# Patient Record
Sex: Male | Born: 1995 | Race: Black or African American | Hispanic: No | Marital: Single | Smoking: Never smoker
Health system: Southern US, Community
[De-identification: ages and names within clinical notes are randomized; demographics above are authoritative.]

---
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
## 2014-08-17 ENCOUNTER — Emergency Department (HOSPITAL_COMMUNITY)
Admission: EM | Admit: 2014-08-17 | Discharge: 2014-08-17 | Disposition: A | Discharge status: Home / Self Care | Payer: Self-pay | Attending: Emergency Medicine | Admitting: Emergency Medicine

## 2014-08-17 ENCOUNTER — Encounter (HOSPITAL_COMMUNITY): Payer: Self-pay | Admitting: Emergency Medicine

## 2014-08-17 DIAGNOSIS — D508 Other iron deficiency anemias: Secondary | ICD-10-CM | POA: Insufficient documentation

## 2014-08-17 DIAGNOSIS — B349 Viral infection, unspecified: Secondary | ICD-10-CM | POA: Insufficient documentation

## 2014-08-17 LAB — URINALYSIS, ROUTINE W REFLEX MICROSCOPIC
BILIRUBIN URINE: NEGATIVE
GLUCOSE, UA: NEGATIVE mg/dL
Hgb urine dipstick: NEGATIVE
KETONES UR: NEGATIVE mg/dL
Leukocytes, UA: NEGATIVE
NITRITE: NEGATIVE
PH: 6.5 (ref 5.0–8.0)
Protein, ur: NEGATIVE mg/dL
Specific Gravity, Urine: 1.02 (ref 1.005–1.030)
UROBILINOGEN UA: 1 mg/dL (ref 0.0–1.0)

## 2014-08-17 LAB — BASIC METABOLIC PANEL
Anion gap: 5 (ref 5–15)
BUN: 10 mg/dL (ref 6–20)
CHLORIDE: 106 mmol/L (ref 101–111)
CO2: 26 mmol/L (ref 22–32)
CREATININE: 0.7 mg/dL (ref 0.61–1.24)
Calcium: 8.8 mg/dL — ABNORMAL LOW (ref 8.9–10.3)
Glucose, Bld: 117 mg/dL — ABNORMAL HIGH (ref 65–99)
Potassium: 4 mmol/L (ref 3.5–5.1)
Sodium: 137 mmol/L (ref 135–145)

## 2014-08-17 LAB — CBC WITH DIFFERENTIAL/PLATELET
BASOS PCT: 1 % (ref 0–1)
Basophils Absolute: 0 10*3/uL (ref 0.0–0.1)
EOS ABS: 0.3 10*3/uL (ref 0.0–0.7)
Eosinophils Relative: 8 % — ABNORMAL HIGH (ref 0–5)
HEMATOCRIT: 37.9 % — AB (ref 39.0–52.0)
Hemoglobin: 12.2 g/dL — ABNORMAL LOW (ref 13.0–17.0)
LYMPHS ABS: 1 10*3/uL (ref 0.7–4.0)
LYMPHS PCT: 25 % (ref 12–46)
MCH: 26.5 pg (ref 26.0–34.0)
MCHC: 32.2 g/dL (ref 30.0–36.0)
MCV: 82.4 fL (ref 78.0–100.0)
MONOS PCT: 11 % (ref 3–12)
Monocytes Absolute: 0.4 10*3/uL (ref 0.1–1.0)
NEUTROS PCT: 55 % (ref 43–77)
Neutro Abs: 2.1 10*3/uL (ref 1.7–7.7)
PLATELETS: 200 10*3/uL (ref 150–400)
RBC: 4.6 MIL/uL (ref 4.22–5.81)
RDW: 13.9 % (ref 11.5–15.5)
WBC: 3.8 10*3/uL — ABNORMAL LOW (ref 4.0–10.5)

## 2014-08-17 MED ORDER — PROMETHAZINE HCL 25 MG RE SUPP: 12.5000 mg | Freq: Once | RECTAL | Status: DC

## 2014-08-17 NOTE — ED Provider Notes (Signed)
CSN: [id number]     Arrival date & time 08/17/14  0015 History   First MD Initiated Contact with Patient 08/17/14 0021     Chief Complaint  Patient presents with  . Fever  . Generalized Body Aches     (Consider location/radiation/quality/duration/timing/severity/associated sxs/prior Treatment) Patient is a 19 y.o. male presenting with URI. The history is provided by the patient.  URI Presenting symptoms: congestion and fever   Presenting symptoms: no cough, no ear pain and no sore throat   Severity:  Mild Onset quality:  Gradual Duration:  1 week Timing:  Intermittent Progression:  Improving Chronicity:  New Relieved by:  None tried Worsened by:  Nothing tried Ineffective treatments:  None tried Associated symptoms: myalgias    Xavier Bradley is a 19 y.o. male who presents to the ED with generalized body aches, fever and nasal congestion for the past week. He states that he has started to feel better today but decided to come in for evaluation. He had one episode of nausea last night but did not vomit and has not had nausea since then.   History reviewed. No pertinent past medical history. History reviewed. No pertinent past surgical history. History reviewed. No pertinent family history. History  Substance Use Topics  . Smoking status: Never Smoker   . Smokeless tobacco: Never Used  . Alcohol Use: No    Review of Systems  Constitutional: Positive for fever.  HENT: Positive for congestion. Negative for ear pain and sore throat.   Respiratory: Negative for cough.   Musculoskeletal: Positive for myalgias.      Allergies  Review of patient's allergies indicates no known allergies.  Home Medications   Prior to Admission medications   Medication Sig Start Date End Date Taking? Authorizing Provider  loratadine-pseudoephedrine (CLARITIN-D 12 HOUR) 5-120 MG per tablet Take 1 tablet by mouth 2 (two) times daily. 05/28/12   Burgess Amor, PA-C   BP 131/88 mmHg  Pulse 81   Temp(Src) 98.9 F (37.2 C) (Oral)  Resp 20  Ht 6' (1.829 m)  Wt 130 lb (58.968 kg)  BMI 17.63 kg/m2  SpO2 100% Physical Exam  Constitutional: He is oriented to person, place, and time. He appears well-developed and well-nourished. No distress.  HENT:  Head: Normocephalic.  Right Ear: Tympanic membrane normal.  Left Ear: Tympanic membrane normal.  Nose: Rhinorrhea present.  Mouth/Throat: Uvula is midline, oropharynx is clear and moist and mucous membranes are normal.  Eyes: EOM are normal.  Neck: Neck supple.  Cardiovascular: Normal rate and regular rhythm.   Pulmonary/Chest: Effort normal. He has no wheezes. He has no rales.  Abdominal: Soft. Bowel sounds are normal. There is no tenderness.  Musculoskeletal: Normal range of motion.  Neurological: He is alert and oriented to person, place, and time. No cranial nerve deficit.  Skin: Skin is warm and dry.  Psychiatric: He has a normal mood and affect. His behavior is normal.  Nursing note and vitals reviewed.   ED Course  Procedures (including critical care time) Labs Review Results for orders placed or performed during the hospital encounter of 08/17/14 (from the past 24 hour(s))  Urinalysis, Routine w reflex microscopic (not at [facility])     Status: None   Collection Time: 08/17/14  1:16 AM  Result Value Ref Range   Color, Urine YELLOW YELLOW   APPearance CLEAR CLEAR   Specific Gravity, Urine 1.020 1.005 - 1.030   pH 6.5 5.0 - 8.0   Glucose, UA NEGATIVE NEGATIVE mg/dL  Hgb urine dipstick NEGATIVE NEGATIVE   Bilirubin Urine NEGATIVE NEGATIVE   Ketones, ur NEGATIVE NEGATIVE mg/dL   Protein, ur NEGATIVE NEGATIVE mg/dL   Urobilinogen, UA 1.0 0.0 - 1.0 mg/dL   Nitrite NEGATIVE NEGATIVE   Leukocytes, UA NEGATIVE NEGATIVE  Basic metabolic panel     Status: Abnormal (Preliminary result)   Collection Time: 08/17/14  1:24 AM  Result Value Ref Range   Sodium 137 135 - 145 mmol/L   Potassium 4.0 3.5 - 5.1 mmol/L   Chloride 106  101 - 111 mmol/L   CO2 26 22 - 32 mmol/L   Glucose, Bld 117 (H) 65 - 99 mg/dL   BUN PENDING 6 - 20 mg/dL   Creatinine, Ser 1.610.70 0.61 - 1.24 mg/dL   Calcium 8.8 (L) 8.9 - 10.3 mg/dL   GFR calc non Af Amer >60 >60 mL/min   GFR calc Af Amer >60 >60 mL/min   Anion gap 5 5 - 15  CBC with Differential/Platelet     Status: Abnormal   Collection Time: 08/17/14  1:24 AM  Result Value Ref Range   WBC 3.8 (L) 4.0 - 10.5 K/uL   RBC 4.60 4.22 - 5.81 MIL/uL   Hemoglobin 12.2 (L) 13.0 - 17.0 g/dL   HCT 09.637.9 (L) 04.539.0 - 40.952.0 %   MCV 82.4 78.0 - 100.0 fL   MCH 26.5 26.0 - 34.0 pg   MCHC 32.2 30.0 - 36.0 g/dL   RDW 81.113.9 91.411.5 - 78.215.5 %   Platelets 200 150 - 400 K/uL   Neutrophils Relative % 55 43 - 77 %   Neutro Abs 2.1 1.7 - 7.7 K/uL   Lymphocytes Relative 25 12 - 46 %   Lymphs Abs 1.0 0.7 - 4.0 K/uL   Monocytes Relative 11 3 - 12 %   Monocytes Absolute 0.4 0.1 - 1.0 K/uL   Eosinophils Relative 8 (H) 0 - 5 %   Eosinophils Absolute 0.3 0.0 - 0.7 K/uL   Basophils Relative 1 0 - 1 %   Basophils Absolute 0.0 0.0 - 0.1 K/uL     MDM  19 y.o. male with generalized body aches, nasal congestion and fever that has improved over the past 24 hours. Stable for d/c without n/v/d, no fever. Patient does have mild anemia and will start vitamins with Fe. He will follow up with a PCP for recheck. He will return here if any problems arise.  Final diagnoses:  Viral illness  Other iron deficiency anemias      [facility] Center[facility] M Neese, NP 08/17/14 [phone]  Devoria AlbeIva Knapp, MD 08/17/14 [phone]

## 2014-08-17 NOTE — Discharge Instructions (Signed)
Your urine and blood work tonight are normal except you have mild anemia.  Take vitamins plus iron and follow up with a primary care doctor to be sure it is improving. Return here as needed.

## 2014-08-17 NOTE — ED Notes (Signed)
Patient complaining of generalized body aches, fever, and nasal congestion since Thursday.

## 2014-12-31 IMAGING — CR DG CHEST 2V
2 series · 2 of 2 positions shown · non-contrast
Comparison: 12/24/2007

CLINICAL DATA: Cough, shortness of breath, fever

CHEST - 2 VIEW

[view not recorded (1 of 2)]
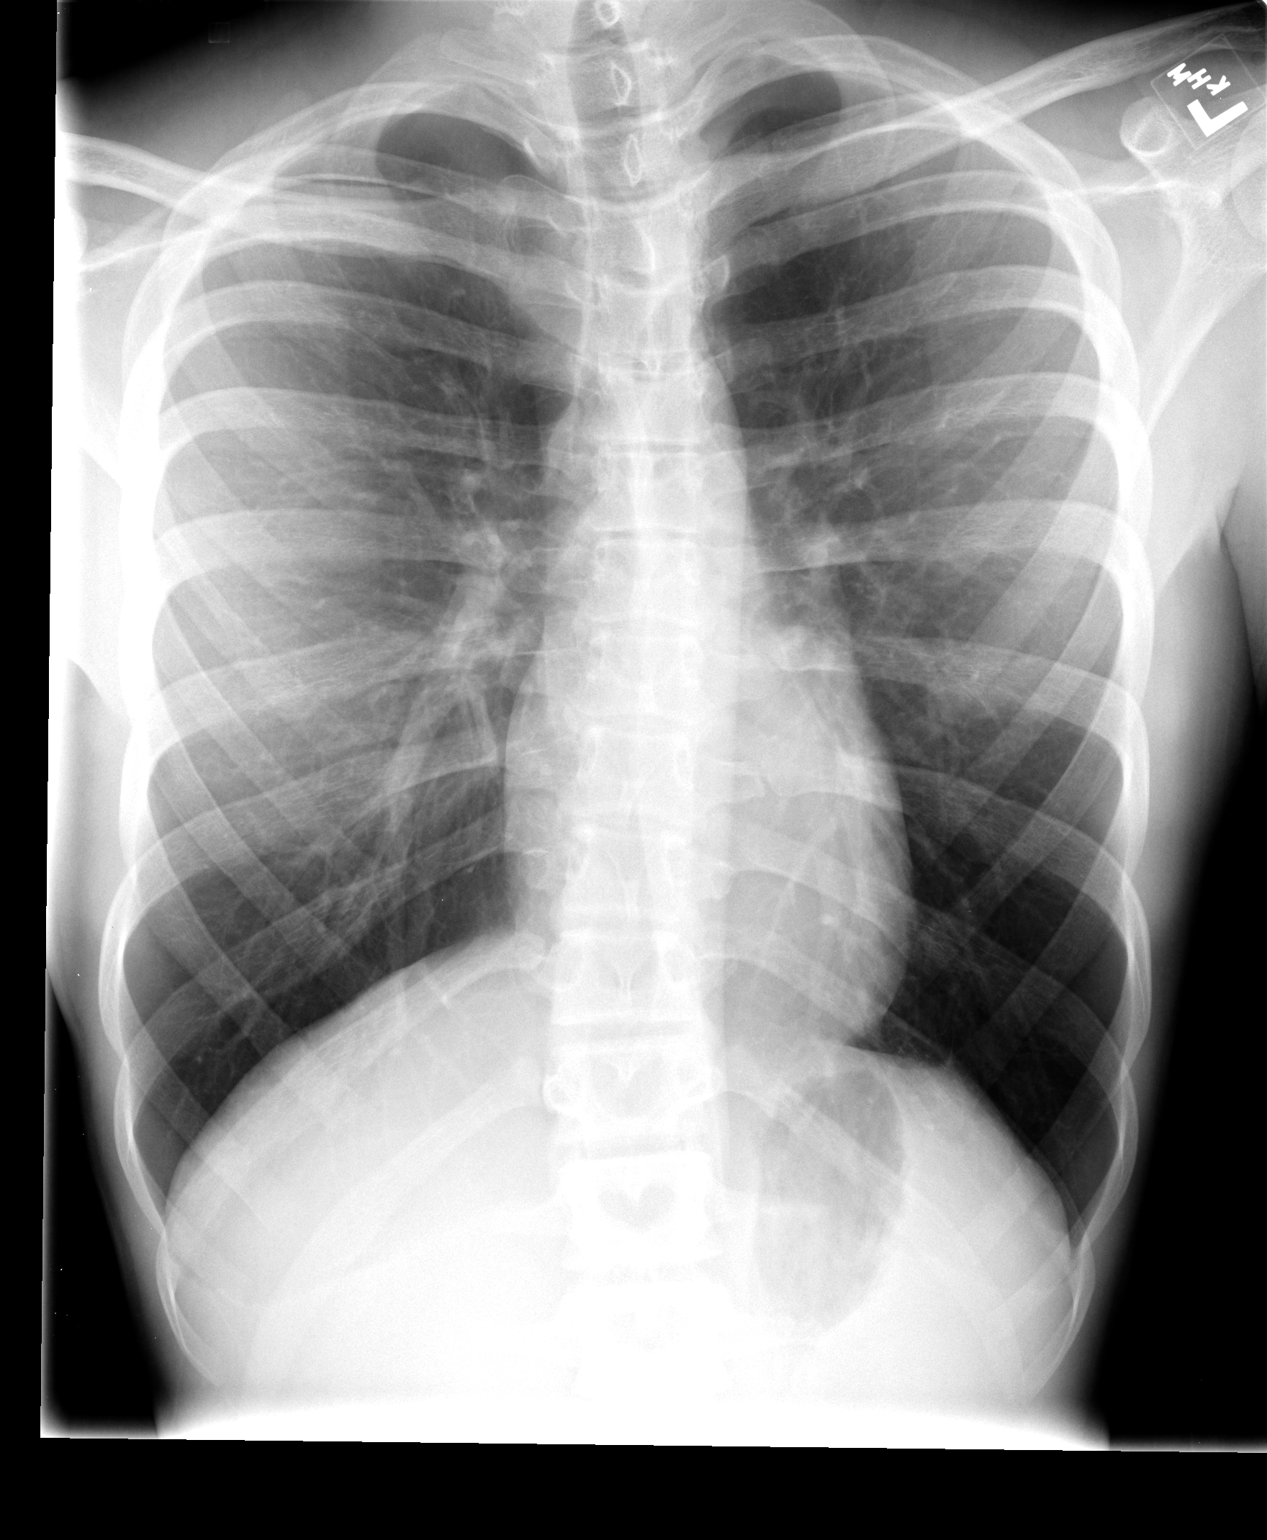

[view not recorded (2 of 2)]
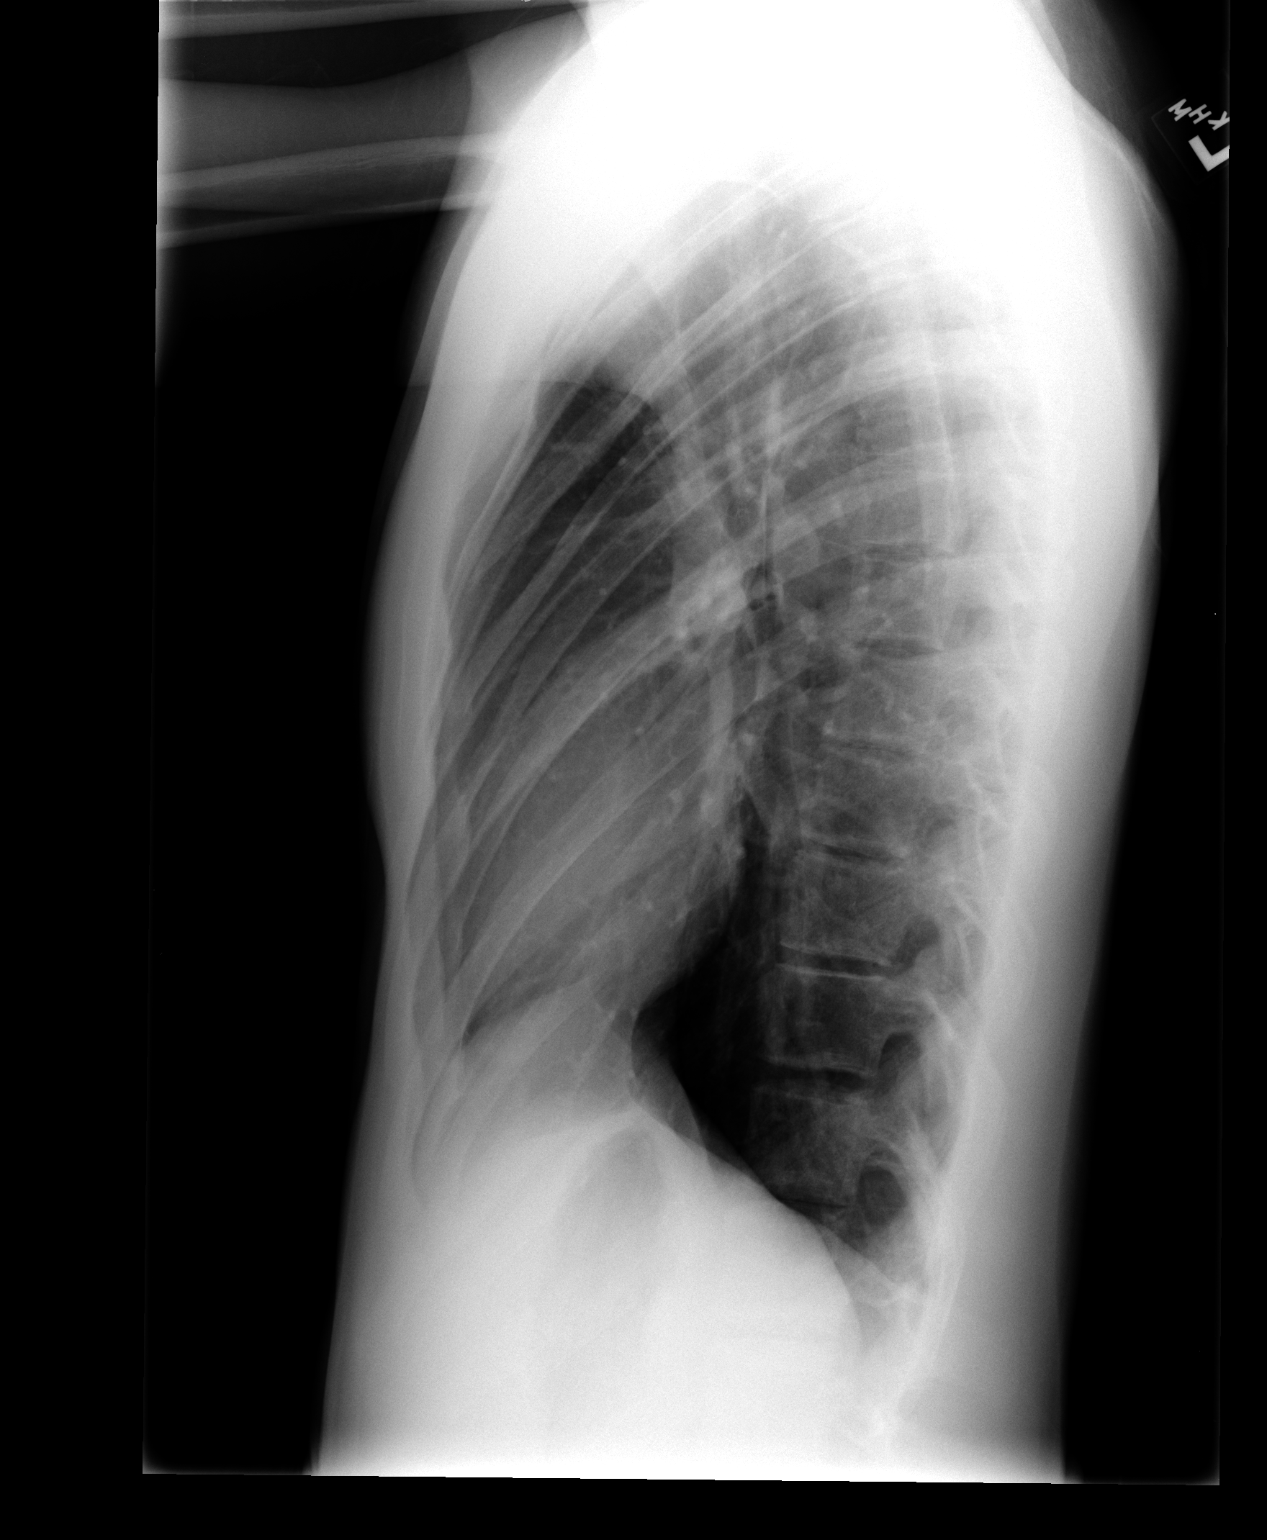

[2 of 2 positions shown; findings below may reference images not displayed]

FINDINGS: Normal heart size, mediastinal contours, and pulmonary vascularity.
Lungs chronically hyperinflated but clear.
No pleural effusion or pneumothorax.
No acute osseous findings.
IMPRESSION: No acute abnormalities.

## 2018-02-17 ENCOUNTER — Emergency Department (HOSPITAL_COMMUNITY): Payer: Worker's Compensation

## 2018-02-17 ENCOUNTER — Emergency Department (HOSPITAL_COMMUNITY)
Admission: EM | Admit: 2018-02-17 | Discharge: 2018-02-17 | Disposition: A | Discharge status: Home / Self Care | Payer: Worker's Compensation | Attending: Emergency Medicine | Admitting: Emergency Medicine

## 2018-02-17 ENCOUNTER — Other Ambulatory Visit: Payer: Self-pay

## 2018-02-17 DIAGNOSIS — Y990 Civilian activity done for income or pay: Secondary | ICD-10-CM | POA: Diagnosis not present

## 2018-02-17 DIAGNOSIS — W3182XA Contact with other commercial machinery, initial encounter: Secondary | ICD-10-CM | POA: Diagnosis not present

## 2018-02-17 DIAGNOSIS — Z23 Encounter for immunization: Secondary | ICD-10-CM | POA: Insufficient documentation

## 2018-02-17 DIAGNOSIS — Y93G1 Activity, food preparation and clean up: Secondary | ICD-10-CM | POA: Diagnosis not present

## 2018-02-17 DIAGNOSIS — S61311A Laceration without foreign body of left index finger with damage to nail, initial encounter: Secondary | ICD-10-CM | POA: Insufficient documentation

## 2018-02-17 DIAGNOSIS — Y92511 Restaurant or cafe as the place of occurrence of the external cause: Secondary | ICD-10-CM | POA: Diagnosis not present

## 2018-02-17 MED ORDER — TETANUS-DIPHTH-ACELL PERTUSSIS 5-2.5-18.5 LF-MCG/0.5 IM SUSP
0.5000 mL | Freq: Once | INTRAMUSCULAR | Status: AC
  Administered 2018-02-17: 0.5 mL via INTRAMUSCULAR
  Filled 2018-02-17: qty 0.5

## 2018-02-17 MED ORDER — LIDOCAINE HCL (PF) 1 % IJ SOLN
5.0000 mL | Freq: Once | INTRAMUSCULAR | Status: AC
  Administered 2018-02-17: 5 mL
  Filled 2018-02-17: qty 6

## 2018-02-17 NOTE — Discharge Instructions (Addendum)
You have been diagnosed today with laceration of the left index finger with nail bed injury.  At this time there does not appear to be the presence of an emergent medical condition, however there is always the potential for conditions to change. Please read and follow the below instructions.  Please return to the Emergency Department immediately for any new or worsening symptoms. Please be sure to follow up with your Primary Care Provider next week regarding your visit today; please call their office to schedule an appointment even if you are feeling better for a follow-up visit. Due to location of your laceration please return to the emergency department in 48 hours for wound recheck.  Additionally you will need to see your primary care doctor or return to the emergency department in 7 days for suture removal. You may use rest, ice and elevation to help with your pain.  May also use over-the-counter anti-inflammatory such as Tylenol or ibuprofen to help with your symptoms.  Get help right away if: You have very bad swelling around the wound. Your pain suddenly gets worse and is very bad. You have painful lumps near the wound or on skin that is anywhere on your body. You have a red streak going away from the wound. The wound is on your hand or foot and you cannot move a finger or toe like normal. The wound is on your hand or foot and you notice that your fingers or toes look pale or bluish.  Please read the additional information packets attached to your discharge summary.  Do not take your medicine if  develop an itchy rash, swelling in your mouth or lips, or difficulty breathing.

## 2018-02-17 NOTE — ED Provider Notes (Addendum)
[facility] EMERGENCY DEPARTMENT Provider Note   CSN: [id number] Arrival date & time: 02/17/18  1133     History   Chief Complaint Chief Complaint  Patient presents with  . Laceration    HPI Xavier Bradley is a 22 y.o. male presenting for laceration of the tip of the left index finger that occurred while at work today.  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  Patient states that pain was immediate, throbbing, severe and worsened with movement and palpation of the finger.  Patient controlled bleeding with direct pressure prior to arrival.  Patient states that pain is somewhat subsided since onset.  Patient is unsure of tetanus status, requesting booster. No other complaints.  HPI  No past medical history on file.  Patient Active Problem List   Diagnosis Date Noted  . GANGLION-HAND/WRIST 11/19/2009    No past surgical history on file.      Home Medications    Prior to Admission medications   Not on File    Family History No family history on file.  Social History Social History   Tobacco Use  . Smoking status: Never Smoker  . Smokeless tobacco: Never Used  Substance Use Topics  . Alcohol use: No  . Drug use: No     Allergies   Patient has no known allergies.   Review of Systems Review of Systems  Constitutional: Negative.  Negative for chills and fever.  Musculoskeletal: Negative.  Negative for arthralgias, joint swelling and myalgias.  Skin: Positive for wound (Laceration of left index finger).  Neurological: Negative.  Negative for weakness and numbness.   Physical Exam Updated Vital Signs BP 118/81 (BP Location: Left Arm)   Pulse 72   Temp 98.5 F (36.9 C) (Oral)   Resp 16   Ht 6\' 1"  (1.854 m)   Wt 63.5 kg   SpO2 100%   BMI 18.47 kg/m   Physical Exam  Constitutional: He appears well-developed and well-nourished. No distress.  HENT:  Head:  Normocephalic and atraumatic.  Right Ear: External ear normal.  Left Ear: External ear normal.  Nose: Nose normal.  Eyes: Pupils are equal, round, and reactive to light. EOM are normal.  Neck: Trachea normal and normal range of motion. No tracheal deviation present.  Pulmonary/Chest: Effort normal. No respiratory distress.  Abdominal: Soft. There is no tenderness. There is no rebound and no guarding.  Musculoskeletal: Normal range of motion.  Left hand: Laceration present to distal left index finger with nailbed involvement.  Please see picture below. Tenderness to palpation directly over laceration. No snuffbox tenderness to palpation. No tenderness to palpation over flexor sheath.  Finger adduction/abduction intact with 5/5 strength.  Thumb opposition intact. Full active and resisted ROM to flexion/extension at wrist, MCP, PIP and DIP of all fingers.  FDS/FDP intact. Grip 5/5 strength.  Radial artery 2+ with <2sec cap refill in all fingers.  Sensation intact to light-tough in median/ulnar/radial distributions.  Neurological: He is alert. GCS eye subscore is 4. GCS verbal subscore is 5. GCS motor subscore is 6.  Speech is clear and goal oriented, follows commands Major Cranial nerves without deficit, no facial droop Normal strength in upper and lower extremities bilaterally including dorsiflexion and plantar flexion, strong and equal grip strength Sensation normal to light touch Moves extremities without ataxia, coordination intact Normal gait  Skin: Skin is warm and dry.  Psychiatric: He has a normal  mood and affect. His behavior is normal.         ED Treatments / Results  Labs (all labs ordered are listed, but only abnormal results are displayed) Labs Reviewed - No data to display  EKG None  Radiology Dg Finger Index Left  Result Date: 02/17/2018 CLINICAL DATA:  Left index finger laceration. EXAM: LEFT INDEX FINGER 2+V COMPARISON:  None. FINDINGS: There is no evidence of  fracture or dislocation. There is no evidence of arthropathy or other focal bone abnormality. Slight irregularity of the soft tissues at the tip of the index finger. No radiopaque foreign body identified. IMPRESSION: 1. Slight irregularity of the soft tissues at the tip of the index finger, consistent with history of laceration. No radiopaque foreign body. 2.  No acute osseous abnormality. Electronically Signed   By: Obie Dredge M.D.   On: 02/17/2018 12:24    Procedures .Marland KitchenLaceration Repair Date/Time: 02/17/2018 5:28 PM Performed by: Bill Salinas, PA-C Authorized by: Bill Salinas, PA-C   Consent:    Consent obtained:  Verbal   Consent given by:  Patient and parent   Risks discussed:  Infection, pain, retained foreign body, vascular damage, tendon damage, poor wound healing, poor cosmetic result, nerve damage and need for additional repair Anesthesia (see MAR for exact dosages):    Anesthesia method:  Local infiltration   Local anesthetic:  Lidocaine 1% w/o epi Laceration details:    Location:  Finger   Finger location:  L index finger   Length (cm):  1   Depth (mm):  3 Repair type:    Repair type:  Simple Pre-procedure details:    Preparation:  Patient was prepped and draped in usual sterile fashion and imaging obtained to evaluate for foreign bodies Exploration:    Hemostasis achieved with:  Direct pressure   Wound exploration: wound explored through full range of motion and entire depth of wound probed and visualized     Wound extent: no foreign bodies/material noted, no muscle damage noted, no nerve damage noted, no tendon damage noted, no underlying fracture noted and no vascular damage noted     Contaminated: no   Treatment:    Area cleansed with:  Betadine and saline   Amount of cleaning:  Extensive   Irrigation solution:  Sterile saline   Irrigation volume:  1 L   Irrigation method:  Pressure wash Skin repair:    Repair method:  Sutures   Suture size:   4-0   Suture material:  Prolene   Suture technique:  Simple interrupted   Number of sutures:  1 Approximation:    Approximation:  Close Post-procedure details:    Dressing: Xeroform gauze.   Patient tolerance of procedure:  Tolerated well, no immediate complications   (including critical care time)  Medications Ordered in ED Medications  Tdap (BOOSTRIX) injection 0.5 mL (0.5 mLs Intramuscular Given 02/17/18 1207)  lidocaine (PF) (XYLOCAINE) 1 % injection 5 mL (5 mLs Infiltration Given 02/17/18 1208)   Initial Impression / Assessment and Plan / ED Course  I have reviewed the triage vital signs and the nursing notes.  Pertinent labs & imaging results that were available during my care of the patient were reviewed by me and considered in my medical decision making (see chart for details).    Xavier Bradley is a 22 y.o. male who presents to ED for laceration of the left index finger.  Wound thoroughly cleaned in ED today. Wound explored and bottom of wound seen  in a bloodless field.  Imaging negative for foreign body or osseous involvement.  Laceration repaired as dictated above. Patient counseled on home wound care.  Patient informed that he will likely lose part if not all of his nail due to injury today. Due to location of laceration involving the nailbed and poor PCP follow-up I have encouraged that the patient return to the emergency department in 48 hours for wound check.  Based on mechanism, examination and thorough cleaning today antibiotics are not indicated at this time.  Tdap booster given.  Patient was urged to return to the Emergency Department for worsening pain, swelling, expanding erythema especially if it streaks away from the affected area, fever, or for any additional concerns. Patient verbalized understanding of return precautions. All questions answered.  At this time there does not appear to be any evidence of an acute emergency medical condition and the patient appears  stable for discharge with appropriate outpatient follow up. Diagnosis was discussed with patient who verbalizes understanding of care plan and is agreeable to discharge. I have discussed return precautions with patient and mother at bedside who verbalize understanding of return precautions. Patient strongly encouraged to return in 48 hours for wound recheck or sooner for new/worsening symptoms.  Patient informed to return for suture removal in 7 days as well.  All questions answered.  Patient was also seen and evaluated by Dr. Adriana Simas during this visit who agrees with plan of care, repair, discharge and return in 48 hours for wound recheck.  Note: Portions of this report may have been transcribed using voice recognition software. Every effort was made to ensure accuracy; however, inadvertent computerized transcription errors may still be present.  Final Clinical Impressions(s) / ED Diagnoses   Final diagnoses:  Laceration of left index finger without foreign body with damage to nail, initial encounter    ED Discharge Orders    None       Bill Salinas, PA-C 02/17/18 1733    Bill Salinas, PA-C 02/17/18 1734    Donnetta Hutching, MD 02/21/18 0004

## 2018-02-17 NOTE — ED Triage Notes (Signed)
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 Pt reports pain to left index finger. Large laceration noted around tip of left index finger. Minimal bleeding noted to site.

## 2018-02-20 ENCOUNTER — Encounter (HOSPITAL_COMMUNITY): Payer: Self-pay | Admitting: Emergency Medicine

## 2018-02-20 ENCOUNTER — Other Ambulatory Visit: Payer: Self-pay

## 2018-02-20 ENCOUNTER — Emergency Department (HOSPITAL_COMMUNITY)
Admission: EM | Admit: 2018-02-20 | Discharge: 2018-02-20 | Disposition: A | Discharge status: Home / Self Care | Payer: Worker's Compensation | Attending: Emergency Medicine | Admitting: Emergency Medicine

## 2018-02-20 DIAGNOSIS — W260XXD Contact with knife, subsequent encounter: Secondary | ICD-10-CM | POA: Insufficient documentation

## 2018-02-20 DIAGNOSIS — Z5189 Encounter for other specified aftercare: Secondary | ICD-10-CM

## 2018-02-20 DIAGNOSIS — S6992XD Unspecified injury of left wrist, hand and finger(s), subsequent encounter: Secondary | ICD-10-CM | POA: Diagnosis present

## 2018-02-20 DIAGNOSIS — S61211D Laceration without foreign body of left index finger without damage to nail, subsequent encounter: Secondary | ICD-10-CM | POA: Insufficient documentation

## 2018-02-20 MED ORDER — BACITRACIN-NEOMYCIN-POLYMYXIN 400-5-5000 EX OINT
TOPICAL_OINTMENT | Freq: Once | CUTANEOUS | Status: AC
  Administered 2018-02-20: 1 via TOPICAL
  Filled 2018-02-20: qty 1

## 2018-02-20 NOTE — ED Triage Notes (Signed)
Pt says was here Friday for finger laceration and was told to return today for recheck.  Pt denies any complications.  Denies pain.

## 2018-02-20 NOTE — ED Notes (Signed)
Left index finger has 1 stitch. No signs of infection noted.

## 2018-02-20 NOTE — Discharge Instructions (Addendum)
Your wound appears to be healing well.  I recommend continuing to keep it covered and apply antibiotic ointment twice daily after a mild soap and water wash.  Stop using hydrogen peroxide on your wound, as discussed this can delay the healing process.  Return on Friday for suture removal as discussed.

## 2018-02-20 NOTE — ED Provider Notes (Signed)
[facility] EMERGENCY DEPARTMENT Provider Note   CSN: [id number] Arrival date & time: 02/20/18  8295     History   Chief Complaint Chief Complaint  Patient presents with  . Wound Check    HPI Xavier Bradley is a 22 y.o. 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  He is right-handed.  He sustained a laceration through the distal finger involving a wedge of the distal nail plate as well.  He was seen here and suturing was performed.  He was advised to return today for recheck of his injury.  He denies any worsening pain, no swelling or drainage from the wound site.  He has been using hydrogen peroxide to clean the wound.  The history is provided by the patient.    History reviewed. No pertinent past medical history.  Patient Active Problem List   Diagnosis Date Noted  . GANGLION-HAND/WRIST 11/19/2009    History reviewed. No pertinent surgical history.      Home Medications    Prior to Admission medications   Not on File    Family History No family history on file.  Social History Social History   Tobacco Use  . Smoking status: Never Smoker  . Smokeless tobacco: Never Used  Substance Use Topics  . Alcohol use: No  . Drug use: No     Allergies   Patient has no known allergies.   Review of Systems Review of Systems  Constitutional: Negative for fever.  Musculoskeletal: Negative for arthralgias, joint swelling and myalgias.  Skin: Positive for wound.  Neurological: Negative for weakness and numbness.     Physical Exam Updated Vital Signs BP 125/85 (BP Location: Right Arm)   Pulse 85   Temp 98.2 F (36.8 C) (Oral)   Resp 16   Ht 6\' 1"  (1.854 m)   Wt 63.5 kg   SpO2 100%   BMI 18.47 kg/m   Physical Exam  Constitutional: He is oriented to person, place, and time. He appears well-developed and well-nourished.  HENT:  Head:  Normocephalic.  Cardiovascular: Normal rate.  Pulmonary/Chest: Effort normal.  Musculoskeletal: He exhibits no tenderness.  Neurological: He is alert and oriented to person, place, and time. No sensory deficit.  Skin: Laceration noted.  Patient with well-healing laceration left distal fingertip with #1 Prolene suture intact.  There is no erythema, edema or drainage.  The wound edges are well approximated and scabbed.  There is a distal wedge laceration through the nail plate with the nail fragment firmly attached.     ED Treatments / Results  Labs (all labs ordered are listed, but only abnormal results are displayed) Labs Reviewed - No data to display  EKG None  Radiology No results found.  Procedures Procedures (including critical care time)  Medications Ordered in ED Medications  neomycin-bacitracin-polymyxin (NEOSPORIN) ointment (1 application Topical Given 02/20/18 1205)     Initial Impression / Assessment and Plan / ED Course  I have reviewed the triage vital signs and the nursing notes.  Pertinent labs & imaging results that were available during my care of the patient were reviewed by me and considered in my medical decision making (see chart for details).     Patient now 3 days out from a distal left index finger laceration involving a small fragment of the nail plate.  There is no sign of infection in his wound today.  He  was advised to stop using hydrogen peroxide on the wound, instead use warm soapy water twice a day, dry completely, apply a thin layer of antibiotic ointment and been applying new dressing.  Plan follow-up in 5 days for suture removal.  Final Clinical Impressions(s) / ED Diagnoses   Final diagnoses:  Visit for wound check    ED Discharge Orders    None       Victoriano Laindol, Mayrene Bastarache, PA-C 02/20/18 1521    Blane OharaZavitz, Joshua, MD 02/20/18 1549

## 2018-02-24 ENCOUNTER — Encounter (HOSPITAL_COMMUNITY): Payer: Self-pay

## 2018-02-24 ENCOUNTER — Other Ambulatory Visit: Payer: Self-pay

## 2018-02-24 ENCOUNTER — Emergency Department (HOSPITAL_COMMUNITY)
Admission: EM | Admit: 2018-02-24 | Discharge: 2018-02-24 | Disposition: A | Discharge status: Home / Self Care | Payer: Worker's Compensation | Attending: Emergency Medicine | Admitting: Emergency Medicine

## 2018-02-24 DIAGNOSIS — Z4802 Encounter for removal of sutures: Secondary | ICD-10-CM | POA: Insufficient documentation

## 2018-02-24 NOTE — Discharge Instructions (Addendum)
Please return to the ED for evaluation if any changes or problem.

## 2018-02-24 NOTE — ED Provider Notes (Signed)
[facility] EMERGENCY DEPARTMENT Provider Note   CSN: [id number] Arrival date & time: 02/24/18  1010     History   Chief Complaint Chief Complaint  Patient presents with  . Suture / Staple Removal    HPI Xavier Bradley is a 22 y.o. male.  Patient is a 22 year old male who presents to the emergency department for suture removal and wound check.  The patient sustained an injury to the left index finger.  He had sutures placed on November 29.  He states that since that time he has not had any drainage, unusual redness, unusual pain, or fever.  He presents now for the sutures to be removed.  The history is provided by the patient.  Suture / Staple Removal  Pertinent negatives include no chest pain, no abdominal pain and no shortness of breath.    History reviewed. No pertinent past medical history.  Patient Active Problem List   Diagnosis Date Noted  . GANGLION-HAND/WRIST 11/19/2009    No past surgical history on file.      Home Medications    Prior to Admission medications   Not on File    Family History No family history on file.  Social History Social History   Tobacco Use  . Smoking status: Never Smoker  . Smokeless tobacco: Never Used  Substance Use Topics  . Alcohol use: No  . Drug use: No     Allergies   Patient has no known allergies.   Review of Systems Review of Systems  Constitutional: Negative for activity change.       All ROS Neg except as noted in HPI  HENT: Negative for nosebleeds.   Eyes: Negative for photophobia and discharge.  Respiratory: Negative for cough, shortness of breath and wheezing.   Cardiovascular: Negative for chest pain and palpitations.  Gastrointestinal: Negative for abdominal pain and blood in stool.  Genitourinary: Negative for dysuria, frequency and hematuria.  Musculoskeletal: Negative for arthralgias, back pain and neck pain.  Skin: Negative.   Neurological: Negative for dizziness, seizures and speech  difficulty.  Psychiatric/Behavioral: Negative for confusion and hallucinations.     Physical Exam Updated Vital Signs BP 130/72 (BP Location: Right Arm)   Pulse 77   Temp 97.9 F (36.6 C) (Oral)   Resp 14   Ht 6\' 1"  (1.854 m)   Wt 63.5 kg   SpO2 100%   BMI 18.47 kg/m   Physical Exam  Constitutional: He is oriented to person, place, and time. He appears well-developed and well-nourished.  Non-toxic appearance.  HENT:  Head: Normocephalic.  Right Ear: Tympanic membrane and external ear normal.  Left Ear: Tympanic membrane and external ear normal.  Eyes: Pupils are equal, round, and reactive to light. EOM and lids are normal.  Neck: Normal range of motion. Neck supple. Carotid bruit is not present.  Cardiovascular: Normal rate, regular rhythm, normal heart sounds, intact distal pulses and normal pulses.  Pulmonary/Chest: Breath sounds normal. No respiratory distress.  Abdominal: Soft. Bowel sounds are normal. There is no tenderness. There is no guarding.  Musculoskeletal: Normal range of motion.  Suture to the left index finger remains intact.  The wound is healing nicely.  No signs of infection.  Lymphadenopathy:       Head (right side): No submandibular adenopathy present.       Head (left side): No submandibular adenopathy present.    He has no cervical adenopathy.  Neurological: He is alert and oriented to person, place, and time. He  has normal strength. No cranial nerve deficit or sensory deficit.  Skin: Skin is warm and dry.  Psychiatric: He has a normal mood and affect. His speech is normal.  Nursing note and vitals reviewed.    ED Treatments / Results  Labs (all labs ordered are listed, but only abnormal results are displayed) Labs Reviewed - No data to display  EKG None  Radiology No results found.  Procedures Procedures (including critical care time)  Medications Ordered in ED Medications - No data to display   Initial Impression / Assessment and  Plan / ED Course  I have reviewed the triage vital signs and the nursing notes.  Pertinent labs & imaging results that were available during my care of the patient were reviewed by me and considered in my medical decision making (see chart for details).       Final Clinical Impressions(s) / ED Diagnoses MDM  Vital signs are within normal limits.  Examination of the wound shows the suture to be in place, and the wound healing nicely.  There is no evidence of a secondary infection present. Suture removed by nursing staff. Patient to return if any signs of advancing infection.   Final diagnoses:  Visit for suture removal    ED Discharge Orders    None       Ivery QualeBryant, Jerimyah Vandunk, PA-C 02/25/18 1704    Samuel JesterMcManus, Kathleen, DO 02/26/18 [phone]

## 2018-02-24 NOTE — ED Triage Notes (Signed)
Pt is here to get left index finger suture removal. They were placed the 29th of November

## 2020-09-21 IMAGING — DX DG FINGER INDEX 2+V*L*
3 series · 3 of 3 positions shown · non-contrast
Comparison: None.

CLINICAL DATA: Left index finger laceration.

EXAM:
LEFT INDEX FINGER 2+V

[finger ap]
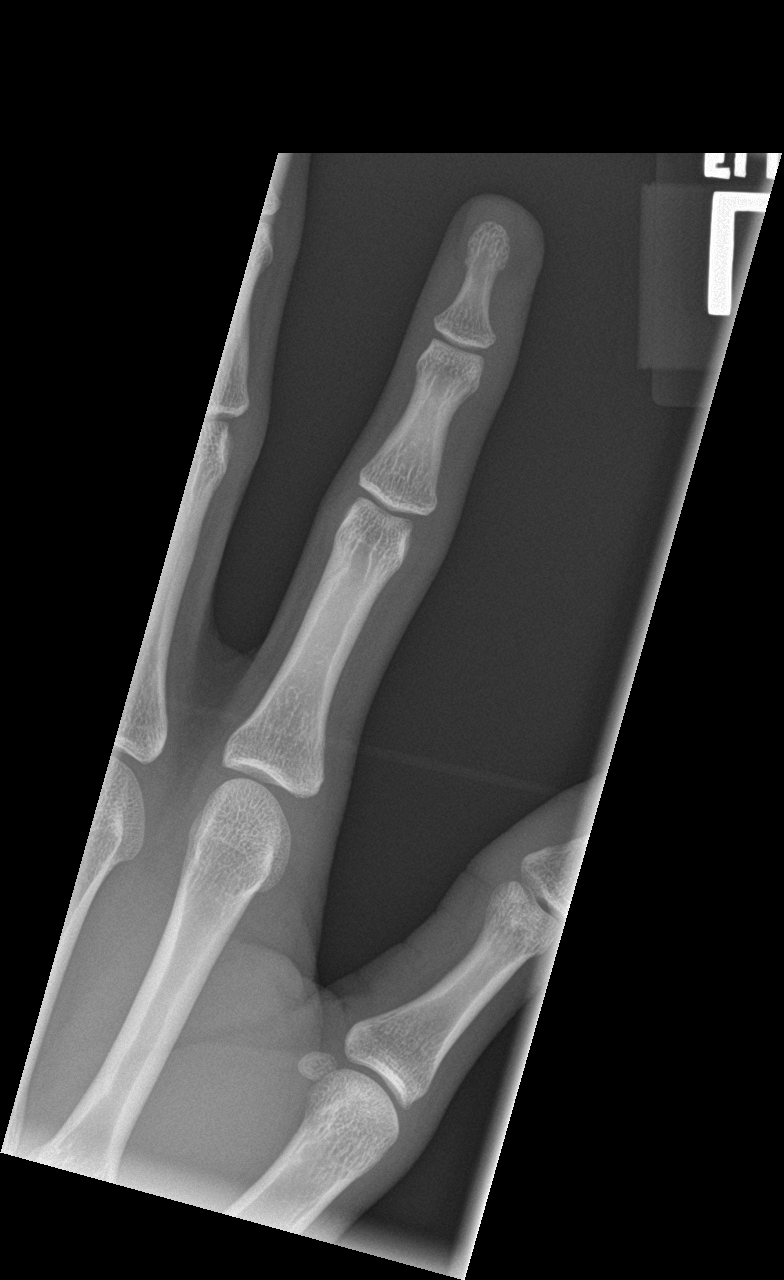

[finger obl]
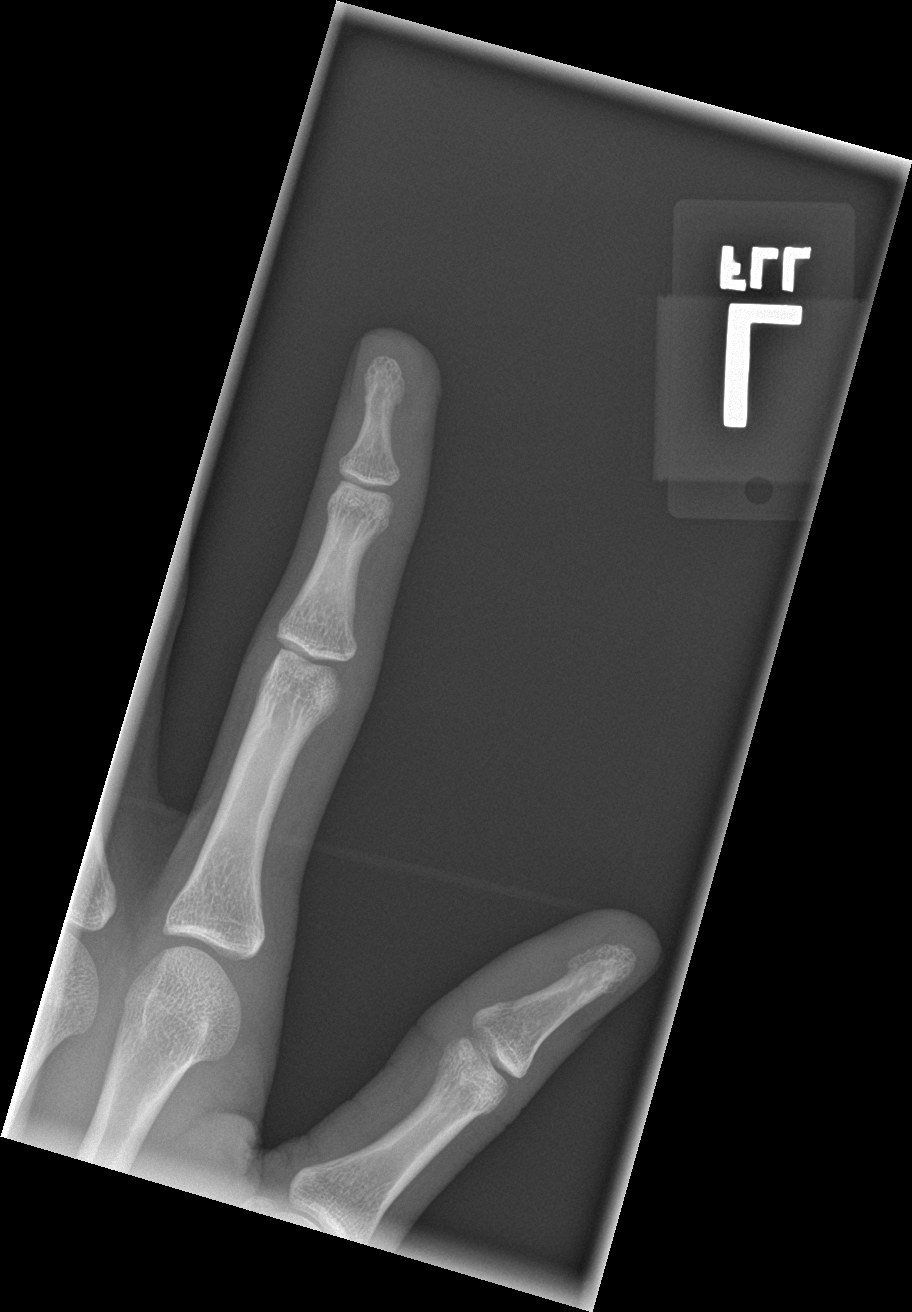

[finger lat]
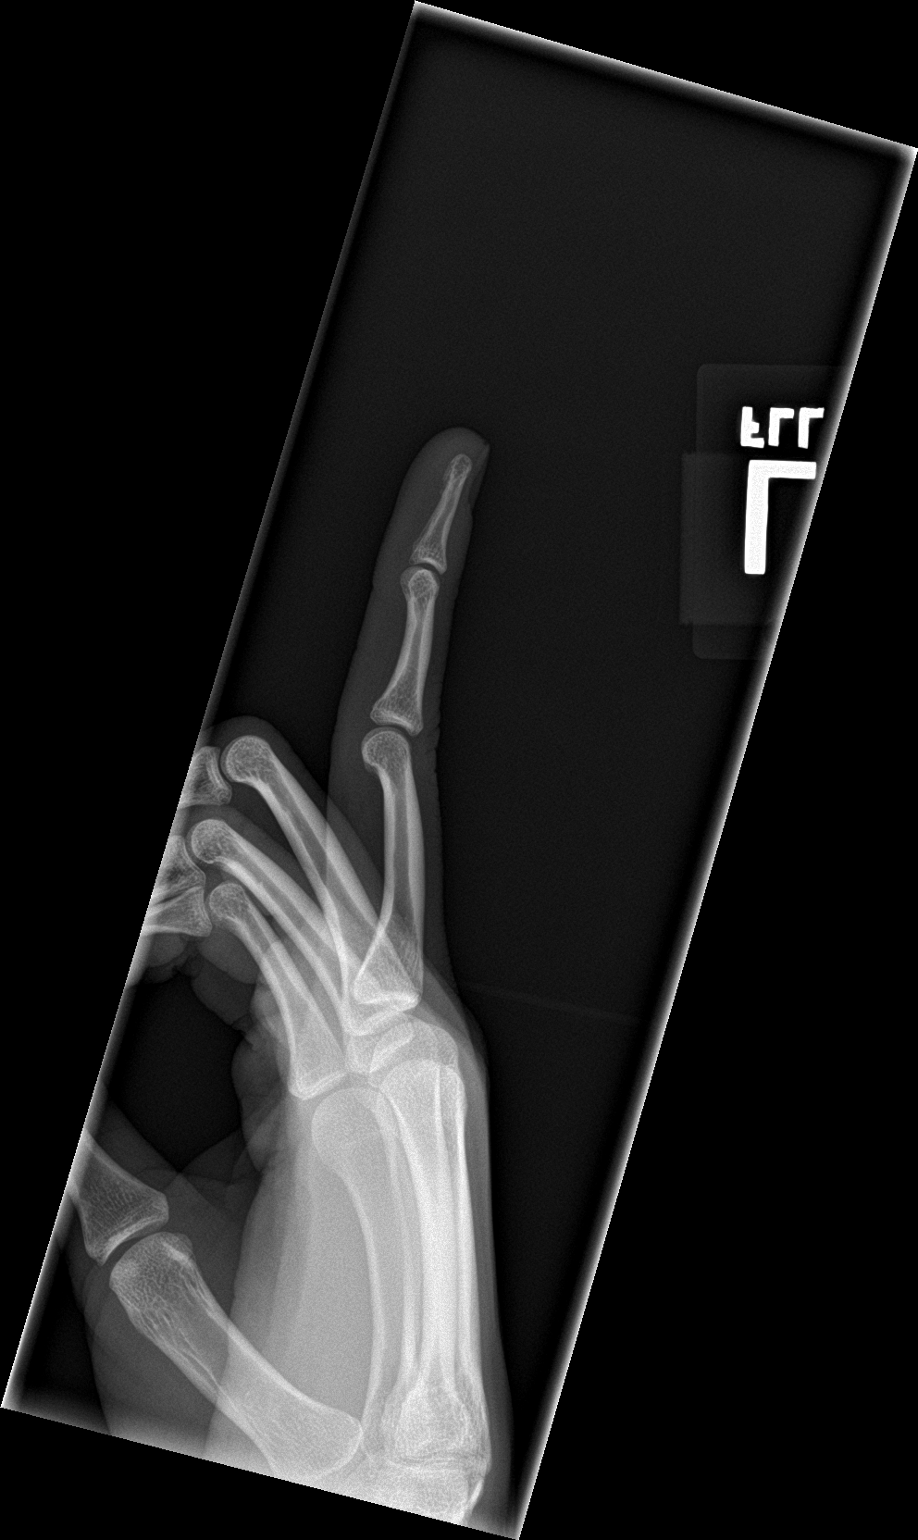

[3 of 3 positions shown; findings below may reference images not displayed]

FINDINGS: There is no evidence of fracture or dislocation. There is no
evidence of arthropathy or other focal bone abnormality. Slight
irregularity of the soft tissues at the tip of the index finger. No
radiopaque foreign body identified.
IMPRESSION: 1. Slight irregularity of the soft tissues at the tip of the index
finger, consistent with history of laceration. No radiopaque foreign
body.
2.  No acute osseous abnormality.

## 2022-03-26 ENCOUNTER — Emergency Department (HOSPITAL_COMMUNITY)
Admission: EM | Admit: 2022-03-26 | Discharge: 2022-03-26 | Disposition: A | Discharge status: Home / Self Care | Payer: Self-pay | Attending: Emergency Medicine | Admitting: Emergency Medicine

## 2022-03-26 ENCOUNTER — Other Ambulatory Visit: Payer: Self-pay

## 2022-03-26 ENCOUNTER — Encounter (HOSPITAL_COMMUNITY): Payer: Self-pay | Admitting: Emergency Medicine

## 2022-03-26 DIAGNOSIS — Z20822 Contact with and (suspected) exposure to covid-19: Secondary | ICD-10-CM | POA: Insufficient documentation

## 2022-03-26 DIAGNOSIS — J029 Acute pharyngitis, unspecified: Secondary | ICD-10-CM | POA: Insufficient documentation

## 2022-03-26 LAB — RESP PANEL BY RT-PCR (RSV, FLU A&B, COVID)  RVPGX2
Influenza A by PCR: NEGATIVE
Influenza B by PCR: NEGATIVE
Resp Syncytial Virus by PCR: NEGATIVE
SARS Coronavirus 2 by RT PCR: NEGATIVE

## 2022-03-26 LAB — GROUP A STREP BY PCR: Group A Strep by PCR: NOT DETECTED

## 2022-03-26 MED ORDER — AMOXICILLIN 500 MG PO CAPS: 500.0000 mg | ORAL_CAPSULE | Freq: Three times a day (TID) | ORAL | 0 refills | Status: AC

## 2022-03-26 NOTE — Discharge Instructions (Signed)
Drink plenty of fluid.  Follow-up with your family doctor next week if not improving

## 2022-03-26 NOTE — ED Triage Notes (Signed)
Pt c/o headache, body ache, and sore throat x 1 week

## 2022-03-26 NOTE — ED Provider Notes (Signed)
[facility] EMERGENCY DEPARTMENT Provider Note   CSN: [id number] Arrival date & time: 03/26/22  1645     History  Chief Complaint  Patient presents with   flu like symptoms     Xavier Bradley is a 27 y.o. male.  Patient has no medical history.  Patient states he had a sore throat for couple weeks and seems to be getting worse  The history is provided by the patient and medical records. No language interpreter was used.  Sore Throat This is a new problem. The current episode started more than 1 week ago. The problem occurs constantly. The problem has not changed since onset.Pertinent negatives include no chest pain, no abdominal pain and no headaches. Nothing aggravates the symptoms. Nothing relieves the symptoms. He has tried nothing for the symptoms. The treatment provided no relief.       Home Medications Prior to Admission medications   Medication Sig Start Date End Date Taking? Authorizing Provider  amoxicillin (AMOXIL) 500 MG capsule Take 1 capsule (500 mg total) by mouth 3 (three) times daily. 03/26/22  Yes Bethann Berkshire, MD      Allergies    Patient has no known allergies.    Review of Systems   Review of Systems  Constitutional:  Negative for appetite change and fatigue.  HENT:  Negative for congestion, ear discharge and sinus pressure.        Sore throat  Eyes:  Negative for discharge.  Respiratory:  Negative for cough.   Cardiovascular:  Negative for chest pain.  Gastrointestinal:  Negative for abdominal pain and diarrhea.  Genitourinary:  Negative for frequency and hematuria.  Musculoskeletal:  Negative for back pain.  Skin:  Negative for rash.  Neurological:  Negative for seizures and headaches.  Psychiatric/Behavioral:  Negative for hallucinations.     Physical Exam Updated Vital Signs BP (!) 137/92 (BP Location: Right Arm)   Pulse (!) 109   Temp 99.1 F (37.3 C) (Oral)   Resp 20   Ht 6\' 2"  (1.88 m)   Wt 63.5 kg   SpO2 98%   BMI 17.97 kg/m   Physical Exam Vitals and nursing note reviewed.  Constitutional:      Appearance: He is well-developed.  HENT:     Head: Normocephalic.     Comments: Pharynx inflamed    Nose: Nose normal.  Eyes:     General: No scleral icterus.    Conjunctiva/sclera: Conjunctivae normal.  Neck:     Thyroid: No thyromegaly.  Cardiovascular:     Rate and Rhythm: Normal rate and regular rhythm.     Heart sounds: No murmur heard.    No friction rub. No gallop.  Pulmonary:     Breath sounds: No stridor. No wheezing or rales.  Chest:     Chest wall: No tenderness.  Abdominal:     General: There is no distension.     Tenderness: There is no abdominal tenderness. There is no rebound.  Musculoskeletal:        General: Normal range of motion.     Cervical back: Neck supple.  Lymphadenopathy:     Cervical: No cervical adenopathy.  Skin:    Findings: No erythema or rash.  Neurological:     Mental Status: He is alert and oriented to person, place, and time.     Motor: No abnormal muscle tone.     Coordination: Coordination normal.  Psychiatric:        Behavior: Behavior normal.  ED Results / Procedures / Treatments   Labs (all labs ordered are listed, but only abnormal results are displayed) Labs Reviewed  RESP PANEL BY RT-PCR (RSV, FLU A&B, COVID)  [id number]  GROUP A STREP BY PCR    EKG None  Radiology No results found.  Procedures Procedures    Medications Ordered in ED Medications - No data to display  ED Course/ Medical Decision Making/ A&P                           Medical Decision Making Risk Prescription drug management.    This patient presents to the ED for concern of throat, this involves an extensive number of treatment options, and is a complaint that carries with it a high risk of complications and morbidity.  The differential diagnosis includes wrist, strep pharyngitis   Co morbidities that complicate the patient evaluation  None none   Additional  history obtained:  Additional history obtained from patient External records from outside source obtained and reviewed including hospital records   Lab Tests:  I Ordered, and personally interpreted labs.  The pertinent results include: Strep and flu negative   Imaging Studies ordered:  No x-ray  Cardiac Monitoring: / EKG:  The patient was maintained on a cardiac monitor.  I personally viewed and interpreted the cardiac monitored which showed an underlying rhythm of: Normal sinus rhythm   Consultations Obtained: No consultant  Problem List / ED Course / Critical interventions / Medication management  Pharyngitis I ordered medication including oxacillin for pharyngitis that has not improved in a week Reevaluation of the patient after these medicines showed that the patient stayed the same I have reviewed the patients home medicines and have made adjustments as needed   Social Determinants of Health:  None   Test / Admission - Considered:  None   Patient with pharyngitis.  Strep negative.  He will be started on amoxicillin will follow-up with his PCP if not improving        Final Clinical Impression(s) / ED Diagnoses Final diagnoses:  Pharyngitis, unspecified etiology    Rx / DC Orders ED Discharge Orders          Ordered    amoxicillin (AMOXIL) 500 MG capsule  3 times daily        03/26/22 2028              Bethann Berkshire, MD 03/27/22 1204
